# Patient Record
Sex: Female | Born: 1945 | Race: White | Hispanic: No
Health system: Southern US, Community
[De-identification: ages and names within clinical notes are randomized; demographics above are authoritative.]

## PROBLEM LIST (undated history)

## (undated) DIAGNOSIS — J9621 Acute and chronic respiratory failure with hypoxia: Secondary | ICD-10-CM

## (undated) DIAGNOSIS — R918 Other nonspecific abnormal finding of lung field: Secondary | ICD-10-CM

## (undated) DIAGNOSIS — J181 Lobar pneumonia, unspecified organism: Secondary | ICD-10-CM

## (undated) DIAGNOSIS — J189 Pneumonia, unspecified organism: Secondary | ICD-10-CM

## (undated) DIAGNOSIS — J449 Chronic obstructive pulmonary disease, unspecified: Secondary | ICD-10-CM

---

## 2018-04-12 ENCOUNTER — Inpatient Hospital Stay
Admit: 2018-04-12 | Discharge: 2018-04-21 | Disposition: A | Discharge status: Home Health | Payer: Medicare Other | Source: Other Acute Inpatient Hospital | Attending: Internal Medicine | Admitting: Internal Medicine

## 2018-04-12 ENCOUNTER — Other Ambulatory Visit (HOSPITAL_COMMUNITY): Payer: Medicare Other

## 2018-04-12 DIAGNOSIS — J449 Chronic obstructive pulmonary disease, unspecified: Secondary | ICD-10-CM | POA: Diagnosis present

## 2018-04-12 DIAGNOSIS — R918 Other nonspecific abnormal finding of lung field: Secondary | ICD-10-CM | POA: Diagnosis present

## 2018-04-12 DIAGNOSIS — J181 Lobar pneumonia, unspecified organism: Secondary | ICD-10-CM

## 2018-04-12 DIAGNOSIS — J9621 Acute and chronic respiratory failure with hypoxia: Secondary | ICD-10-CM | POA: Diagnosis present

## 2018-04-12 DIAGNOSIS — J189 Pneumonia, unspecified organism: Secondary | ICD-10-CM

## 2018-04-12 HISTORY — DX: Acute and chronic respiratory failure with hypoxia: J96.21

## 2018-04-12 HISTORY — DX: Chronic obstructive pulmonary disease, unspecified: J44.9

## 2018-04-12 HISTORY — DX: Lobar pneumonia, unspecified organism: J18.1

## 2018-04-12 HISTORY — DX: Pneumonia, unspecified organism: J18.9

## 2018-04-12 HISTORY — DX: Other nonspecific abnormal finding of lung field: R91.8

## 2018-04-12 LAB — BASIC METABOLIC PANEL
Anion gap: 9 (ref 5–15)
BUN: 14 mg/dL (ref 8–23)
CALCIUM: 9.3 mg/dL (ref 8.9–10.3)
CO2: 33 mmol/L — ABNORMAL HIGH (ref 22–32)
Chloride: 94 mmol/L — ABNORMAL LOW (ref 98–111)
Creatinine, Ser: 0.62 mg/dL (ref 0.44–1.00)
GFR calc Af Amer: >60 mL/min (ref >60)
GFR calc non Af Amer: >60 mL/min (ref >60)
Glucose, Bld: 84 mg/dL (ref 70–99)
Potassium: 4.1 mmol/L (ref 3.5–5.1)
Sodium: 136 mmol/L (ref 135–145)

## 2018-04-12 LAB — CBC
HCT: 35.1 % — ABNORMAL LOW (ref 36.0–46.0)
Hemoglobin: 10.8 g/dL — ABNORMAL LOW (ref 12.0–15.0)
MCH: 31.2 pg (ref 26.0–34.0)
MCHC: 30.8 g/dL (ref 30.0–36.0)
MCV: 101.4 fL — ABNORMAL HIGH (ref 80.0–100.0)
PLATELETS: 232 10*3/uL (ref 150–400)
RBC: 3.46 MIL/uL — ABNORMAL LOW (ref 3.87–5.11)
RDW: 12.8 % (ref 11.5–15.5)
WBC: 6 10*3/uL (ref 4.0–10.5)
nRBC: 0 % (ref 0.0–0.2)

## 2018-04-12 LAB — PROTIME-INR
INR: 1.13
PROTHROMBIN TIME: 14.4 s (ref 11.4–15.2)

## 2018-04-13 DIAGNOSIS — J449 Chronic obstructive pulmonary disease, unspecified: Secondary | ICD-10-CM

## 2018-04-13 DIAGNOSIS — J9621 Acute and chronic respiratory failure with hypoxia: Secondary | ICD-10-CM | POA: Diagnosis not present

## 2018-04-13 DIAGNOSIS — R918 Other nonspecific abnormal finding of lung field: Secondary | ICD-10-CM

## 2018-04-13 DIAGNOSIS — J181 Lobar pneumonia, unspecified organism: Secondary | ICD-10-CM

## 2018-04-13 NOTE — Consult Note (Signed)
[facility]  Date of Service: 04/13/2018  PULMONARY CRITICAL CARE CONSULT   Laura KinsmanVirginia Phillips  GNF:[id number]  DOB: 09-16-1945   DOA: 04/12/2018  Referring Physician: Carron CurieAli Hijazi, MD  HPI: Laura Phillips is a 72 y.o. female seen for follow up of Acute on Chronic Respiratory Failure.  Patient has multiple medical problems including Sjogren's syndrome chronic back pain non-Hodgkin's lymphoma and COPD presented to the original facility with increasing shortness of breath.  At that time the patient was seen patient was started on aggressive bronchodilator therapy also was started on steroids in the form of prednisone.  Her COPD is deemed to be severe with an FEV1 of approximately 23% she is on chronic oxygen at home on 3 L/min.  And she is also on chronic steroids approximately 10 mg of prednisone daily.  She has a recent history of pneumonia which was treated.  A CT scan was done and it was noted that she had a lung mass.  She was referred to oncology for possible biopsy and it was felt that she might have recurrence of non-Hodgkin's lymphoma.  Because her respiratory status was not stable it was decided that the biopsy would be deferred until she was more stable.  She at this time is doing better.  I was asked to see tomorrow to evaluate the medicines however with the findings of her lung mass we need to further evaluate this.  Review of Systems:  ROS performed and is unremarkable other than noted above.  Past Medical History:  Diagnosis Date  . Benign familial tremor  . Chronic pain  . Colon polyps  . COPD (chronic obstructive pulmonary disease) (CMS/HCC) (HCC)  . DDD (degenerative disc disease), cervical  . Depression  . Diverticulitis  . Emphysema lung (CMS/HCC) (HCC)  . Esophageal stricture  . Hyperlipidemia  . Hypertension  . Lymphoma (CMS/HCC) [facility](HCC)   Past medical history has been independently reviewed by  me.  Past Surgical History:   Past Surgical History:  Procedure Laterality Date  . ABDOMINAL SURGERY  . APPENDECTOMY  . CARPAL TUNNEL RELEASE  . COLONOSCOPY  . HIP ARTHROPLASTY  . HYSTERECTOMY  . ORTHOPEDIC SURGERY  . UPPER GASTROINTESTINAL ENDOSCOPY    Allergies  Allergen Reactions  . Avelox [Moxifloxacin] Anaphylaxis  . Quinolones Anaphylaxis  . Adhesive Rash  Ok with stretchy tape  . Augmentin [Amoxicillin-Pot Clavulanate] GI intolerance and Nausea And Vomiting  . Tapentadol Rash   Allergies are independently reviewed by me.  Social History:   Social History   Tobacco Use  . Smoking status: Current Every Day Smoker  Packs/day: 1.50  Last attempt to quit: 08/14/2016  Years since quitting: 1.6  . Smokeless tobacco: Never Used  Substance Use Topics  . Alcohol use: No  . Drug use: No     Medications: Reviewed on Rounds  Physical Exam:  Vitals: Temperature 97.8 pulse 70 respiratory 18 blood pressure 121/70 saturations 94%  Ventilator Settings currently off the ventilator on nasal cannula  . General: Comfortable at this time . Eyes: Grossly normal lids, irises & conjunctiva . ENT: grossly tongue is normal . Neck: no obvious mass . Cardiovascular: S1-S2 normal no gallop or rub . Respiratory: No rhonchi no rales are noted . Abdomen: Soft nontender . Skin: no rash seen on limited exam . Musculoskeletal: not rigid . Psychiatric:unable to assess . Neurologic: no seizure no involuntary movements         Labs on Admission:  Basic Metabolic Panel: Recent Labs  Lab 04/12/18 0707  NA 136  K 4.1  CL 94*  CO2 33*  GLUCOSE 84  BUN 14  CREATININE 0.62  CALCIUM 9.3    No results for input(s): PHART, PCO2ART, PO2ART, HCO3, O2SAT in the last 168 hours.  Liver Function Tests: No results for input(s): AST, ALT, ALKPHOS, BILITOT, PROT, ALBUMIN in the last 168 hours. No results for input(s): LIPASE, AMYLASE in the last 168 hours. No results for input(s):  AMMONIA in the last 168 hours.  CBC: Recent Labs  Lab 04/12/18 0707  WBC 6.0  HGB 10.8*  HCT 35.1*  MCV 101.4*  PLT 232    Cardiac Enzymes: No results for input(s): CKTOTAL, CKMB, CKMBINDEX, TROPONINI in the last 168 hours.  BNP (last 3 results) No results for input(s): BNP in the last 8760 hours.  ProBNP (last 3 results) No results for input(s): PROBNP in the last 8760 hours.   Radiological Exams on Admission: Dg Chest Port 1 View  Result Date: 04/12/2018 CLINICAL DATA:  COPD exacerbation. EXAM: PORTABLE CHEST 1 VIEW COMPARISON:  None. FINDINGS: Cardiac silhouette upper normal in size. Thoracic aorta atherosclerotic. Hilar and mediastinal contours otherwise unremarkable. Emphysematous changes throughout both lungs with hyperinflation and mildly prominent bronchovascular markings diffusely. Lungs otherwise clear. No confluent airspace consolidation. No pleural effusions. RIGHT jugular central venous catheter tip projects over the UPPER SVC. IMPRESSION: 1. No acute cardiopulmonary disease. 2.  Emphysema. ([id number](ICD10-J43.9) Electronically Signed   By: Hulan Saashomas  Lawrence M.D.   On: 04/12/2018 12:50    Assessment/Plan Active Problems:   Acute on chronic respiratory failure with hypoxia (HCC)   COPD, severe (HCC)   Lung mass   Left lower lobe pneumonia (HCC)   1. Acute on chronic respiratory failure with hypoxia patient is doing fine right now on nasal cannula we will continue to titrate oxygen as tolerated continue pulmonary toilet secretion management supportive care. 2. COPD medications reviewed discussed with the staff on rounds medications will be adjusted accordingly 3. Lung mass she needs further evaluation I recommended that we get a CT scan here in the hospital if able to then she could possibly have a biopsy for diagnosis we will discuss this further with primary care team. 4. Left lower lobe pneumonia treated we will continue with present management.   I have personally  seen and evaluated the patient, evaluated laboratory and imaging results, formulated the assessment and plan and placed orders. The Patient requires high complexity decision making for assessment and support.  Case was discussed on Rounds with the Respiratory Therapy Staff Time Spent 70minutes  Yevonne PaxSaadat A , MD [facility]arksFCCP Pulmonary Critical Care Medicine Sleep Medicine

## 2018-04-14 ENCOUNTER — Encounter: Payer: Self-pay | Admitting: Internal Medicine

## 2018-04-14 DIAGNOSIS — J9621 Acute and chronic respiratory failure with hypoxia: Secondary | ICD-10-CM | POA: Diagnosis not present

## 2018-04-14 DIAGNOSIS — J189 Pneumonia, unspecified organism: Secondary | ICD-10-CM | POA: Diagnosis present

## 2018-04-14 DIAGNOSIS — J449 Chronic obstructive pulmonary disease, unspecified: Secondary | ICD-10-CM | POA: Diagnosis present

## 2018-04-14 DIAGNOSIS — R918 Other nonspecific abnormal finding of lung field: Secondary | ICD-10-CM | POA: Diagnosis present

## 2018-04-14 DIAGNOSIS — J181 Lobar pneumonia, unspecified organism: Secondary | ICD-10-CM

## 2018-04-14 MED ORDER — FOLIC ACID 1 MG PO TABS
1.00 | ORAL_TABLET | ORAL | Status: DC
Start: 2018-04-12 — End: 2018-04-14

## 2018-04-14 MED ORDER — BENZONATATE 100 MG PO CAPS
100.00 | ORAL_CAPSULE | ORAL | Status: DC
Start: ? — End: 2018-04-14

## 2018-04-14 MED ORDER — PRAMIPEXOLE DIHYDROCHLORIDE 0.5 MG PO TABS
1.50 | ORAL_TABLET | ORAL | Status: DC
Start: 2018-04-12 — End: 2018-04-14

## 2018-04-14 MED ORDER — HYDROMORPHONE HCL 2 MG PO TABS
2.00 | ORAL_TABLET | ORAL | Status: DC
Start: ? — End: 2018-04-14

## 2018-04-14 MED ORDER — OMEPRAZOLE 20 MG PO CPDR
40.00 | DELAYED_RELEASE_CAPSULE | ORAL | Status: DC
Start: 2018-04-12 — End: 2018-04-14

## 2018-04-14 MED ORDER — ACETAMINOPHEN 325 MG PO TABS
650.00 | ORAL_TABLET | ORAL | Status: DC
Start: ? — End: 2018-04-14

## 2018-04-14 MED ORDER — LUBIPROSTONE 24 MCG PO CAPS
24.00 | ORAL_CAPSULE | ORAL | Status: DC
Start: 2018-04-12 — End: 2018-04-14

## 2018-04-14 MED ORDER — LEVALBUTEROL HCL 1.25 MG/3ML IN NEBU
1.25 | INHALATION_SOLUTION | RESPIRATORY_TRACT | Status: DC
Start: 2018-04-12 — End: 2018-04-14

## 2018-04-14 MED ORDER — IPRATROPIUM BROMIDE 0.02 % IN SOLN
0.50 | RESPIRATORY_TRACT | Status: DC
Start: 2018-04-12 — End: 2018-04-14

## 2018-04-14 MED ORDER — ONDANSETRON HCL 4 MG PO TABS
8.00 | ORAL_TABLET | ORAL | Status: DC
Start: ? — End: 2018-04-14

## 2018-04-14 MED ORDER — GUAIFENESIN ER 600 MG PO TB12
1200.00 | ORAL_TABLET | ORAL | Status: DC
Start: 2018-04-12 — End: 2018-04-14

## 2018-04-14 MED ORDER — VENLAFAXINE HCL 37.5 MG PO TABS
75.00 | ORAL_TABLET | ORAL | Status: DC
Start: 2018-04-12 — End: 2018-04-14

## 2018-04-14 MED ORDER — GENERIC EXTERNAL MEDICATION
15.00 | Status: DC
Start: 2018-04-12 — End: 2018-04-14

## 2018-04-14 MED ORDER — HYDROXYCHLOROQUINE SULFATE 200 MG PO TABS
200.00 | ORAL_TABLET | ORAL | Status: DC
Start: 2018-04-12 — End: 2018-04-14

## 2018-04-14 MED ORDER — METHYLPREDNISOLONE SODIUM SUCC 40 MG IJ SOLR
40.00 | INTRAMUSCULAR | Status: DC
Start: 2018-04-12 — End: 2018-04-14

## 2018-04-14 MED ORDER — HYDROCOD POLST-CPM POLST ER 10-8 MG/5ML PO SUER
5.00 | ORAL | Status: DC
Start: ? — End: 2018-04-14

## 2018-04-14 MED ORDER — PRIMIDONE 250 MG PO TABS
375.00 | ORAL_TABLET | ORAL | Status: DC
Start: 2018-04-12 — End: 2018-04-14

## 2018-04-14 MED ORDER — BUSPIRONE HCL 5 MG PO TABS
5.00 | ORAL_TABLET | ORAL | Status: DC
Start: 2018-04-12 — End: 2018-04-14

## 2018-04-14 MED ORDER — ALBUTEROL SULFATE (2.5 MG/3ML) 0.083% IN NEBU
2.50 | INHALATION_SOLUTION | RESPIRATORY_TRACT | Status: DC
Start: ? — End: 2018-04-14

## 2018-04-14 MED ORDER — POTASSIUM CHLORIDE CRYS ER 20 MEQ PO TBCR
20.00 | EXTENDED_RELEASE_TABLET | ORAL | Status: DC
Start: 2018-04-12 — End: 2018-04-14

## 2018-04-14 MED ORDER — ENOXAPARIN SODIUM 40 MG/0.4ML ~~LOC~~ SOLN
40.00 | SUBCUTANEOUS | Status: DC
Start: 2018-04-12 — End: 2018-04-14

## 2018-04-14 MED ORDER — GABAPENTIN 300 MG PO CAPS
300.00 | ORAL_CAPSULE | ORAL | Status: DC
Start: 2018-04-12 — End: 2018-04-14

## 2018-04-14 NOTE — Progress Notes (Signed)
[facility]   PULMONARY CRITICAL CARE SERVICE  PROGRESS NOTE  Date of Service: 04/14/2018  Laura Phillips  ZOX:[id number]  DOB: [birth year]   DOA: 04/12/2018  Referring Physician: Carron CurieAli Hijazi, MD  HPI: Laura KinsmanVirginia Co is a 72 y.o. female seen for follow up of Acute on Chronic Respiratory Failure.  Patient is doing little bit better remains on nasal cannula 3 L oxygen.  The patient is stable on the current inhalers.  I spoke with her respiratory therapist and we adjusted her nebulizers accordingly.  Medications: Reviewed on Rounds  Physical Exam:  Vitals: Temperature 97.9 pulse 76 respiratory 24 blood pressure 123/71 saturations 95%  Ventilator Settings off the ventilator on 3 L  . General: Comfortable at this time . Eyes: Grossly normal lids, irises & conjunctiva . ENT: grossly tongue is normal . Neck: no obvious mass . Cardiovascular: S1 S2 normal no gallop . Respiratory: No rhonchi or rales are noted at this time . Abdomen: soft . Skin: no rash seen on limited exam . Musculoskeletal: not rigid . Psychiatric:unable to assess . Neurologic: no seizure no involuntary movements         Lab Data:   Basic Metabolic Panel: Recent Labs  Lab 04/12/18 0707  NA 136  K 4.1  CL 94*  CO2 33*  GLUCOSE 84  BUN 14  CREATININE 0.62  CALCIUM 9.3    ABG: No results for input(s): PHART, PCO2ART, PO2ART, HCO3, O2SAT in the last 168 hours.  Liver Function Tests: No results for input(s): AST, ALT, ALKPHOS, BILITOT, PROT, ALBUMIN in the last 168 hours. No results for input(s): LIPASE, AMYLASE in the last 168 hours. No results for input(s): AMMONIA in the last 168 hours.  CBC: Recent Labs  Lab 04/12/18 0707  WBC 6.0  HGB 10.8*  HCT 35.1*  MCV 101.4*  PLT 232    Cardiac Enzymes: No results for input(s): CKTOTAL, CKMB, CKMBINDEX, TROPONINI in the last 168 hours.  BNP (last 3 results) No results for input(s): BNP in the  last 8760 hours.  ProBNP (last 3 results) No results for input(s): PROBNP in the last 8760 hours.  Radiological Exams: No results found.  Assessment/Plan Active Problems:   Acute on chronic respiratory failure with hypoxia (HCC)   COPD, severe (HCC)   Lung mass   Left lower lobe pneumonia (HCC)   1. Acute on chronic respiratory failure with hypoxia continue with oxygen therapy she is stable on 3 L.  In addition inhalers have been adjusted. 2. Severe COPD at baseline continue with the duo nebs as ordered.  She will need follow-up with her primary pulmonologist after discharge. 3. Lung mass she will need follow-up after discharge with primary pulmonologist oncologist 4. Left lower lobe pneumonia treated we will continue to monitor   I have personally seen and evaluated the patient, evaluated laboratory and imaging results, formulated the assessment and plan and placed orders. The Patient requires high complexity decision making for assessment and support.  Case was discussed on Rounds with the Respiratory Therapy Staff  Yevonne PaxSaadat A Richy Spradley, MD [facility] HospitalFCCP Pulmonary Critical Care Medicine Sleep Medicine

## 2018-04-15 DIAGNOSIS — J449 Chronic obstructive pulmonary disease, unspecified: Secondary | ICD-10-CM | POA: Diagnosis not present

## 2018-04-15 DIAGNOSIS — J9621 Acute and chronic respiratory failure with hypoxia: Secondary | ICD-10-CM | POA: Diagnosis not present

## 2018-04-15 DIAGNOSIS — J181 Lobar pneumonia, unspecified organism: Secondary | ICD-10-CM | POA: Diagnosis not present

## 2018-04-15 DIAGNOSIS — R918 Other nonspecific abnormal finding of lung field: Secondary | ICD-10-CM | POA: Diagnosis not present

## 2018-04-15 LAB — VANCOMYCIN, TROUGH: Vancomycin Tr: 10 ug/mL — ABNORMAL LOW (ref 15–20)

## 2018-04-15 NOTE — Progress Notes (Signed)
Pulmonary Critical Care Medicine [facility]   PULMONARY CRITICAL CARE SERVICE  PROGRESS NOTE  Date of Service: 04/15/2018  Laura KinsmanVirginia Phillips  [id number]  DOB: [birth year]   DOA: 04/12/2018  Referring Physician: Carron CurieAli Hijazi, MD  HPI: Laura KinsmanVirginia Phillips is a 72 y.o. female seen for follow up of Acute on Chronic Respiratory Failure.  Patient currently is on nasal cannula doing fairly well.  She had several questions regarding the findings of her CT scan at the previous facility these were answered.  Also requested a follow-up CT scan to be done.  Medications: Reviewed on Rounds  Physical Exam:  Vitals: Temperature 97.7 pulse 93 respiratory rate 16 blood pressure 137/68 saturations 99%  Ventilator Settings off the ventilator on nasal cannula  . General: Comfortable at this time . Eyes: Grossly normal lids, irises & conjunctiva . ENT: grossly tongue is normal . Neck: no obvious mass . Cardiovascular: S1 S2 normal no gallop . Respiratory: No rhonchi or rales are noted . Abdomen: soft . Skin: no rash seen on limited exam . Musculoskeletal: not rigid . Psychiatric:unable to assess . Neurologic: no seizure no involuntary movements         Lab Data:   Basic Metabolic Panel: Recent Labs  Lab 04/12/18 0707  NA 136  K 4.1  CL 94*  CO2 33*  GLUCOSE 84  BUN 14  CREATININE 0.62  CALCIUM 9.3    ABG: No results for input(s): PHART, PCO2ART, PO2ART, HCO3, O2SAT in the last 168 hours.  Liver Function Tests: No results for input(s): AST, ALT, ALKPHOS, BILITOT, PROT, ALBUMIN in the last 168 hours. No results for input(s): LIPASE, AMYLASE in the last 168 hours. No results for input(s): AMMONIA in the last 168 hours.  CBC: Recent Labs  Lab 04/12/18 0707  WBC 6.0  HGB 10.8*  HCT 35.1*  MCV 101.4*  PLT 232    Cardiac Enzymes: No results for input(s): CKTOTAL, CKMB, CKMBINDEX, TROPONINI in the last 168 hours.  BNP (last 3 results) No results for  input(s): BNP in the last 8760 hours.  ProBNP (last 3 results) No results for input(s): PROBNP in the last 8760 hours.  Radiological Exams: No results found.  Assessment/Plan Active Problems:   Acute on chronic respiratory failure with hypoxia (HCC)   COPD, severe (HCC)   Lung mass   Left lower lobe pneumonia (HCC)   1. Acute on chronic respiratory failure with hypoxia we will continue with oxygen therapy she is on 3 L at baseline at home which will be continued 2. Severe COPD advanced disease continue with present management 3. Lung masses likely recurrence of non-Hodgkin's lymphoma we will continue with supportive care follow-up CT she needs follow-up with oncology 4. Lower lobe pneumonia treated we will continue to monitor   I have personally seen and evaluated the patient, evaluated laboratory and imaging results, formulated the assessment and plan and placed orders. The Patient requires high complexity decision making for assessment and support.  Case was discussed on Rounds with the Respiratory Therapy Staff  Yevonne PaxSaadat A Keyden Pavlov, MD [facility]P Pulmonary Critical Care Medicine Sleep Medicine

## 2018-04-16 ENCOUNTER — Other Ambulatory Visit (HOSPITAL_COMMUNITY): Payer: Medicare Other

## 2018-04-16 DIAGNOSIS — J449 Chronic obstructive pulmonary disease, unspecified: Secondary | ICD-10-CM | POA: Diagnosis not present

## 2018-04-16 DIAGNOSIS — R918 Other nonspecific abnormal finding of lung field: Secondary | ICD-10-CM | POA: Diagnosis not present

## 2018-04-16 DIAGNOSIS — J9621 Acute and chronic respiratory failure with hypoxia: Secondary | ICD-10-CM | POA: Diagnosis not present

## 2018-04-16 DIAGNOSIS — J181 Lobar pneumonia, unspecified organism: Secondary | ICD-10-CM | POA: Diagnosis not present

## 2018-04-16 MED ORDER — IOHEXOL 300 MG/ML  SOLN
75.0000 mL | Freq: Once | INTRAMUSCULAR | Status: AC | PRN
  Administered 2018-04-16: 75 mL via INTRAVENOUS

## 2018-04-16 NOTE — Progress Notes (Addendum)
Pulmonary Critical Care Medicine [facility] [place]   PULMONARY CRITICAL CARE SERVICE  PROGRESS NOTE  Date of Service: 04/16/2018  Laura Phillips  [id number]  DOB: [birth year]   DOA: 04/12/2018  Referring Physician: Carron Curie, MD  HPI: Laura Phillips is a 73 y.o. female seen for follow up of Acute on Chronic Respiratory Failure.  Patient currently doing well on 3 L/min via nasal cannula.  No distress noted.  Medications: Reviewed on Rounds  Physical Exam:  Vitals: Pulse 78 respirations 18 blood pressure 130/65 O2 saturation 96% temp 97.6  Ventilator Settings patient is not currently on ventilator  . General: Comfortable at this time . Eyes: Grossly normal lids, irises & conjunctiva . ENT: grossly tongue is normal . Neck: no obvious mass . Cardiovascular: S1 S2 normal no gallop . Respiratory: No wheezes or rhonchi noted . Abdomen: soft . Skin: no rash seen on limited exam . Musculoskeletal: not rigid . Psychiatric:unable to assess . Neurologic: no seizure no involuntary movements         Lab Data:   Basic Metabolic Panel: Recent Labs  Lab 04/12/18 0707  NA 136  K 4.1  CL 94*  CO2 33*  GLUCOSE 84  BUN 14  CREATININE 0.62  CALCIUM 9.3    ABG: No results for input(s): PHART, PCO2ART, PO2ART, HCO3, O2SAT in the last 168 hours.  Liver Function Tests: No results for input(s): AST, ALT, ALKPHOS, BILITOT, PROT, ALBUMIN in the last 168 hours. No results for input(s): LIPASE, AMYLASE in the last 168 hours. No results for input(s): AMMONIA in the last 168 hours.  CBC: Recent Labs  Lab 04/12/18 0707  WBC 6.0  HGB 10.8*  HCT 35.1*  MCV 101.4*  PLT 232    Cardiac Enzymes: No results for input(s): CKTOTAL, CKMB, CKMBINDEX, TROPONINI in the last 168 hours.  BNP (last 3 results) No results for input(s): BNP in the last 8760 hours.  ProBNP (last 3 results) No results for input(s): PROBNP in the last 8760 hours.  Radiological Exams: Ct  Chest W Contrast  Result Date: 04/16/2018 CLINICAL DATA:  Inpatient. Non-Hodgkin's lymphoma. COPD. Acute on chronic respiratory failure. Hypoxia. EXAM: CT CHEST WITH CONTRAST TECHNIQUE: Multidetector CT imaging of the chest was performed during intravenous contrast administration. CONTRAST:  14mL OMNIPAQUE IOHEXOL 300 MG/ML  SOLN COMPARISON:  04/12/2018 chest radiograph. FINDINGS: Cardiovascular: Normal heart size. No significant pericardial effusion/thickening. Three-vessel coronary atherosclerosis. Atherosclerotic nonaneurysmal thoracic aorta. Normal caliber pulmonary arteries. No central pulmonary emboli. Right internal jugular central venous catheter terminates in the middle third of the SVC. Mediastinum/Nodes: No discrete thyroid nodules. Unremarkable esophagus. No pathologically enlarged axillary, mediastinal or hilar lymph nodes. Lungs/Pleura: No pneumothorax. No pleural effusion. Severe centrilobular emphysema with diffuse bronchial wall thickening. Spiculated solid 1.6 cm basilar right upper lobe pulmonary nodule (series 4/image 91). Irregular bandlike nodular focus of consolidation in the posterior left lower lobe measuring 1.8 x 1.6 cm (series 4/image 124). No additional significant pulmonary nodules. Upper abdomen: No acute abnormality. Musculoskeletal: No aggressive appearing focal osseous lesions. Multiple healed lower left rib fractures. Moderate thoracic spondylosis. IMPRESSION: 1. Spiculated solid 1.6 cm basilar right upper lobe pulmonary nodule, morphologically suspicious for primary bronchogenic carcinoma. Multidisciplinary thoracic oncology consultation suggested. PET-CT suggested on a short term outpatient basis for further evaluation. 2. Irregular bandlike nodular focus of consolidation in the posterior left lower lobe, indeterminate, potentially representing evolving postinfectious scarring. Recommend attention on follow-up chest CT in 3 months. 3. Severe centrilobular emphysema with diffuse  bronchial wall  thickening, compatible with the provided history of COPD. 4. No thoracic adenopathy. 5. Three-vessel coronary atherosclerosis. Aortic Atherosclerosis (ICD10-I70.0) and Emphysema (ICD10-J43.9). Electronically Signed   By: Delbert PhenixJason A Poff M.D.   On: 04/16/2018 02:41    Assessment/Plan Active Problems:   Acute on chronic respiratory failure with hypoxia (HCC)   COPD, severe (HCC)   Lung mass   Left lower lobe pneumonia (HCC)   1. Acute on chronic respiratory failure with hypoxia continue with oxygen therapy on 3 L nasal cannula which is her baseline at home. 2. Severe COPD advanced disease continue with present management 3. Lung masses likely recurrence of non-Hodgkin's lymphoma continue supportive care follow-up CT performed today, results above. 4. Lower lobe pneumonia treated continue to monitor   I have personally seen and evaluated the patient, evaluated laboratory and imaging results, formulated the assessment and plan and placed orders. The Patient requires high complexity decision making for assessment and support.  Case was discussed on Rounds with the Respiratory Therapy Staff  Yevonne PaxSaadat A Klaira Pesci, MD [facility] Pulmonary Critical Care Medicine Sleep Medicine

## 2018-04-17 DIAGNOSIS — J449 Chronic obstructive pulmonary disease, unspecified: Secondary | ICD-10-CM | POA: Diagnosis not present

## 2018-04-17 DIAGNOSIS — J9621 Acute and chronic respiratory failure with hypoxia: Secondary | ICD-10-CM | POA: Diagnosis not present

## 2018-04-17 DIAGNOSIS — R918 Other nonspecific abnormal finding of lung field: Secondary | ICD-10-CM | POA: Diagnosis not present

## 2018-04-17 DIAGNOSIS — J181 Lobar pneumonia, unspecified organism: Secondary | ICD-10-CM | POA: Diagnosis not present

## 2018-04-17 NOTE — Progress Notes (Signed)
[facility] [place]   PULMONARY CRITICAL CARE SERVICE  PROGRESS NOTE  Date of Service: 04/17/2018  Laura KinsmanVirginia Cristiano  AVW:[id number]  DOB: [birth year]   DOA: 04/12/2018  Referring Physician: Carron CurieAli Hijazi, MD  HPI: Laura Phillips is a 73 y.o. female seen for follow up of Acute on Chronic Respiratory Failure.  Patient is currently on nasal cannula has been having some issues with wheezing.  She is on Advair as well as duo nebs.  Patient was also on steroids.  She does have some issues with anxiety.  Medications: Reviewed on Rounds  Physical Exam:  Vitals: Temperature 97.5 pulse 72 respiratory 18 blood pressure 130/70 saturations 95%  Ventilator Settings off the ventilator on nasal cannula right now  . General: Comfortable at this time . Eyes: Grossly normal lids, irises & conjunctiva . ENT: grossly tongue is normal . Neck: no obvious mass . Cardiovascular: S1 S2 normal no gallop . Respiratory: No rhonchi or rales are noted . Abdomen: soft . Skin: no rash seen on limited exam . Musculoskeletal: not rigid . Psychiatric:unable to assess . Neurologic: no seizure no involuntary movements         Lab Data:   Basic Metabolic Panel: Recent Labs  Lab 04/12/18 0707  NA 136  K 4.1  CL 94*  CO2 33*  GLUCOSE 84  BUN 14  CREATININE 0.62  CALCIUM 9.3    ABG: No results for input(s): PHART, PCO2ART, PO2ART, HCO3, O2SAT in the last 168 hours.  Liver Function Tests: No results for input(s): AST, ALT, ALKPHOS, BILITOT, PROT, ALBUMIN in the last 168 hours. No results for input(s): LIPASE, AMYLASE in the last 168 hours. No results for input(s): AMMONIA in the last 168 hours.  CBC: Recent Labs  Lab 04/12/18 0707  WBC 6.0  HGB 10.8*  HCT 35.1*  MCV 101.4*  PLT 232    Cardiac Enzymes: No results for input(s): CKTOTAL, CKMB, CKMBINDEX, TROPONINI in the last 168 hours.  BNP (last 3 results) No results for input(s): BNP in the last  8760 hours.  ProBNP (last 3 results) No results for input(s): PROBNP in the last 8760 hours.  Radiological Exams: Ct Chest W Contrast  Result Date: 04/16/2018 CLINICAL DATA:  Inpatient. Non-Hodgkin's lymphoma. COPD. Acute on chronic respiratory failure. Hypoxia. EXAM: CT CHEST WITH CONTRAST TECHNIQUE: Multidetector CT imaging of the chest was performed during intravenous contrast administration. CONTRAST:  75mL OMNIPAQUE IOHEXOL 300 MG/ML  SOLN COMPARISON:  04/12/2018 chest radiograph. FINDINGS: Cardiovascular: Normal heart size. No significant pericardial effusion/thickening. Three-vessel coronary atherosclerosis. Atherosclerotic nonaneurysmal thoracic aorta. Normal caliber pulmonary arteries. No central pulmonary emboli. Right internal jugular central venous catheter terminates in the middle third of the SVC. Mediastinum/Nodes: No discrete thyroid nodules. Unremarkable esophagus. No pathologically enlarged axillary, mediastinal or hilar lymph nodes. Lungs/Pleura: No pneumothorax. No pleural effusion. Severe centrilobular emphysema with diffuse bronchial wall thickening. Spiculated solid 1.6 cm basilar right upper lobe pulmonary nodule (series 4/image 91). Irregular bandlike nodular focus of consolidation in the posterior left lower lobe measuring 1.8 x 1.6 cm (series 4/image 124). No additional significant pulmonary nodules. Upper abdomen: No acute abnormality. Musculoskeletal: No aggressive appearing focal osseous lesions. Multiple healed lower left rib fractures. Moderate thoracic spondylosis. IMPRESSION: 1. Spiculated solid 1.6 cm basilar right upper lobe pulmonary nodule, morphologically suspicious for primary bronchogenic carcinoma. Multidisciplinary thoracic oncology consultation suggested. PET-CT suggested on a short term outpatient basis for further evaluation. 2. Irregular bandlike nodular focus of consolidation in the posterior left lower  lobe, indeterminate, potentially representing evolving  postinfectious scarring. Recommend attention on follow-up chest CT in 3 months. 3. Severe centrilobular emphysema with diffuse bronchial wall thickening, compatible with the provided history of COPD. 4. No thoracic adenopathy. 5. Three-vessel coronary atherosclerosis. Aortic Atherosclerosis (ICD10-I70.0) and Emphysema (ICD10-J43.9). Electronically Signed   By: Delbert PhenixJason A Poff M.D.   On: 04/16/2018 02:41    Assessment/Plan Active Problems:   Acute on chronic respiratory failure with hypoxia (HCC)   COPD, severe (HCC)   Lung mass   Left lower lobe pneumonia (HCC)   1. Acute on chronic respiratory failure with hypoxia patient will continue with oxygen therapy as ordered. 2. History of non-Hodgkin's lymphoma.  She had a CT scan which shows a solid basilar pulmonary nodule and also irregular consolidation areas in the left lower lobe which is indeterminate.  She and I had spoke about this and she wants to be followed up by her oncologist that she has been seeing. 3. Severe COPD continue with inhaler steroids.  Monitor her response 4. Left lower lobe pneumonia has been treated we will continue to monitor closely   I have personally seen and evaluated the patient, evaluated laboratory and imaging results, formulated the assessment and plan and placed orders. The Patient requires high complexity decision making for assessment and support.  Case was discussed on Rounds with the Respiratory Therapy Staff  Yevonne PaxSaadat A Khan, MD [facility] Pulmonary Critical Care Medicine Sleep Medicine

## 2018-04-18 DIAGNOSIS — R918 Other nonspecific abnormal finding of lung field: Secondary | ICD-10-CM | POA: Diagnosis not present

## 2018-04-18 DIAGNOSIS — J181 Lobar pneumonia, unspecified organism: Secondary | ICD-10-CM | POA: Diagnosis not present

## 2018-04-18 DIAGNOSIS — J449 Chronic obstructive pulmonary disease, unspecified: Secondary | ICD-10-CM | POA: Diagnosis not present

## 2018-04-18 DIAGNOSIS — J9621 Acute and chronic respiratory failure with hypoxia: Secondary | ICD-10-CM | POA: Diagnosis not present

## 2018-04-18 LAB — CBC WITH DIFFERENTIAL/PLATELET
Abs Immature Granulocytes: 0.07 10*3/uL (ref 0.00–0.07)
BASOS ABS: 0.1 10*3/uL (ref 0.0–0.1)
Basophils Relative: 1 %
Eosinophils Absolute: 0.1 10*3/uL (ref 0.0–0.5)
Eosinophils Relative: 1 %
HCT: 36.4 % (ref 36.0–46.0)
Hemoglobin: 11.5 g/dL — ABNORMAL LOW (ref 12.0–15.0)
Immature Granulocytes: 1 %
Lymphocytes Relative: 8 %
Lymphs Abs: 0.7 10*3/uL (ref 0.7–4.0)
MCH: 31.9 pg (ref 26.0–34.0)
MCHC: 31.6 g/dL (ref 30.0–36.0)
MCV: 101.1 fL — ABNORMAL HIGH (ref 80.0–100.0)
Monocytes Absolute: 0.2 10*3/uL (ref 0.1–1.0)
Monocytes Relative: 2 %
NEUTROS ABS: 7.7 10*3/uL (ref 1.7–7.7)
Neutrophils Relative %: 87 %
Platelets: 219 10*3/uL (ref 150–400)
RBC: 3.6 MIL/uL — ABNORMAL LOW (ref 3.87–5.11)
RDW: 13 % (ref 11.5–15.5)
WBC: 8.7 10*3/uL (ref 4.0–10.5)
nRBC: 0 % (ref 0.0–0.2)

## 2018-04-18 LAB — CK TOTAL AND CKMB (NOT AT ARMC)
CK TOTAL: 51 U/L (ref 38–234)
CK, MB: 5 ng/mL (ref 0.5–5.0)
CK, MB: 4.7 ng/mL (ref 0.5–5.0)
Relative Index: INVALID (ref 0.0–2.5)
Relative Index: INVALID (ref 0.0–2.5)
Total CK: 45 U/L (ref 38–234)

## 2018-04-18 LAB — TROPONIN I
Troponin I: 0.04 ng/mL (ref <0.03)
Troponin I: 0.04 ng/mL (ref <0.03)
Troponin I: 0.03 ng/mL (ref <0.03)

## 2018-04-18 NOTE — Progress Notes (Signed)
Pulmonary Critical Care Medicine [facility] [place]   PULMONARY CRITICAL CARE SERVICE  PROGRESS NOTE  Date of Service: 04/18/2018  Laura Phillips  ZOX:[id number]  DOB: 06-15-1945   DOA: 04/12/2018  Referring Physician: Carron Curie, MD  HPI: Laura Phillips is a 73 y.o. female seen for follow up of Acute on Chronic Respiratory Failure.  Patient is comfortable without distress she remains on nasal cannula at this time.  The patient had questions about her CT scan which were answered.  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  She still wants to follow-up with her primary treatment team after discharge  Medications: Reviewed on Rounds  Physical Exam:  Vitals: Temperature 98.1 pulse 87 respiratory rate 20 blood pressure 118/66 saturations 96%  Ventilator Settings off the ventilator on nasal cannula  . General: Comfortable at this time . Eyes: Grossly normal lids, irises & conjunctiva . ENT: grossly tongue is normal . Neck: no obvious mass . Cardiovascular: S1 S2 normal no gallop . Respiratory: No rhonchi rales are noted at this time . Abdomen: soft . Skin: no rash seen on limited exam . Musculoskeletal: not rigid . Psychiatric:unable to assess . Neurologic: no seizure no involuntary movements         Lab Data:   Basic Metabolic Panel: Recent Labs  Lab 04/12/18 0707  NA 136  K 4.1  CL 94*  CO2 33*  GLUCOSE 84  BUN 14  CREATININE 0.62  CALCIUM 9.3    ABG: No results for input(s): PHART, PCO2ART, PO2ART, HCO3, O2SAT in the last 168 hours.  Liver Function Tests: No results for input(s): AST, ALT, ALKPHOS, BILITOT, PROT, ALBUMIN in the last 168 hours. No results for input(s): LIPASE, AMYLASE in the last 168 hours. No results for input(s): AMMONIA in the last 168 hours.  CBC: Recent Labs  Lab 04/12/18 0707  WBC 6.0  HGB 10.8*  HCT 35.1*  MCV 101.4*  PLT 232    Cardiac  Enzymes: No results for input(s): CKTOTAL, CKMB, CKMBINDEX, TROPONINI in the last 168 hours.  BNP (last 3 results) No results for input(s): BNP in the last 8760 hours.  ProBNP (last 3 results) No results for input(s): PROBNP in the last 8760 hours.  Radiological Exams: No results found.  Assessment/Plan Active Problems:   Acute on chronic respiratory failure with hypoxia (HCC)   COPD, severe (HCC)   Lung mass   Left lower lobe pneumonia (HCC)   1. Acute on chronic respiratory failure with hypoxia we will continue with nasal cannula titrate oxygen as tolerated.  We will continue pulmonary toilet supportive care. 2. Lung mass worrisome for neoplastic process.  She is going to need follow-up after discharge with her primary oncologist. 3. Severe COPD at baseline we will continue with present management. 4. Left lower lobe pneumonia treated we will continue with supportive care and continue oxygen therapy   I have personally seen and evaluated the patient, evaluated laboratory and imaging results, formulated the assessment and plan and placed orders. The Patient requires high complexity decision making for assessment and support.  Case was discussed on Rounds with the Respiratory Therapy Staff  Yevonne Pax, MD [facility] Pulmonary Critical Care Medicine Sleep Medicine

## 2018-04-19 DIAGNOSIS — R918 Other nonspecific abnormal finding of lung field: Secondary | ICD-10-CM | POA: Diagnosis not present

## 2018-04-19 DIAGNOSIS — J181 Lobar pneumonia, unspecified organism: Secondary | ICD-10-CM | POA: Diagnosis not present

## 2018-04-19 DIAGNOSIS — J449 Chronic obstructive pulmonary disease, unspecified: Secondary | ICD-10-CM | POA: Diagnosis not present

## 2018-04-19 DIAGNOSIS — J9621 Acute and chronic respiratory failure with hypoxia: Secondary | ICD-10-CM | POA: Diagnosis not present

## 2018-04-19 LAB — CK TOTAL AND CKMB (NOT AT ARMC)
CK, MB: 3.3 ng/mL (ref 0.5–5.0)
CK, MB: 3.4 ng/mL (ref 0.5–5.0)
CK, MB: 3.5 ng/mL (ref 0.5–5.0)
CK, MB: 2.5 ng/mL (ref 0.5–5.0)
Relative Index: INVALID (ref 0.0–2.5)
Relative Index: INVALID (ref 0.0–2.5)
Relative Index: INVALID (ref 0.0–2.5)
Relative Index: INVALID (ref 0.0–2.5)
Total CK: 38 U/L (ref 38–234)
Total CK: 59 U/L (ref 38–234)
Total CK: 49 U/L (ref 38–234)
Total CK: 25 U/L — ABNORMAL LOW (ref 38–234)

## 2018-04-19 LAB — TROPONIN I
Troponin I: <0.03 ng/mL (ref <0.03)
Troponin I: <0.03 ng/mL (ref <0.03)
Troponin I: <0.03 ng/mL (ref <0.03)

## 2018-04-19 NOTE — Progress Notes (Signed)
Pulmonary Critical Care Medicine [facility] [place]   PULMONARY CRITICAL CARE SERVICE  PROGRESS NOTE  Date of Service: 04/19/2018  Chyrel Larocque  PPI:[id number]  DOB: March 15, 1946   DOA: 04/12/2018  Referring Physician: Carron Curie, MD  HPI: Adylyn Heiting is a 73 y.o. female seen for follow up of Acute on Chronic Respiratory Failure.  Patient is doing fine she is on nasal cannula at this time comfortable without distress  Medications: Reviewed on Rounds  Physical Exam:  Vitals: Temperature 98.4 pulse 94 respiratory rate 22 blood pressure 126/61 saturations 96%  Ventilator Settings off the ventilator on nasal cannula  . General: Comfortable at this time . Eyes: Grossly normal lids, irises & conjunctiva . ENT: grossly tongue is normal . Neck: no obvious mass . Cardiovascular: S1 S2 normal no gallop . Respiratory: No rhonchi or rales are noted . Abdomen: soft . Skin: no rash seen on limited exam . Musculoskeletal: not rigid . Psychiatric:unable to assess . Neurologic: no seizure no involuntary movements         Lab Data:   Basic Metabolic Panel: No results for input(s): NA, K, CL, CO2, GLUCOSE, BUN, CREATININE, CALCIUM, MG, PHOS in the last 168 hours.  ABG: No results for input(s): PHART, PCO2ART, PO2ART, HCO3, O2SAT in the last 168 hours.  Liver Function Tests: No results for input(s): AST, ALT, ALKPHOS, BILITOT, PROT, ALBUMIN in the last 168 hours. No results for input(s): LIPASE, AMYLASE in the last 168 hours. No results for input(s): AMMONIA in the last 168 hours.  CBC: Recent Labs  Lab 04/18/18 1639  WBC 8.7  NEUTROABS 7.7  HGB 11.5*  HCT 36.4  MCV 101.1*  PLT 219    Cardiac Enzymes: Recent Labs  Lab 04/18/18 1400 04/18/18 1638 04/18/18 1639 04/18/18 1916 04/19/18 0031 04/19/18 0643 04/19/18 1256  CKTOTAL 51 45  --   --  38 59 49  CKMB 5.0 4.7  --   --  3.3 3.4 3.5  TROPONINI 0.04*  --  0.04* 0.03* <0.03 <0.03 <0.03    BNP (last  3 results) No results for input(s): BNP in the last 8760 hours.  ProBNP (last 3 results) No results for input(s): PROBNP in the last 8760 hours.  Radiological Exams: No results found.  Assessment/Plan Active Problems:   Acute on chronic respiratory failure with hypoxia (HCC)   COPD, severe (HCC)   Lung mass   Left lower lobe pneumonia (HCC)   1. Acute on chronic respiratory failure hypoxia continue with oxygen therapy as tolerated 2. Severe COPD at baseline continue with nebs 3. Lung mass follow-up after discharge 4. Left lower lobe pneumonia treated clinically improving   I have personally seen and evaluated the patient, evaluated laboratory and imaging results, formulated the assessment and plan and placed orders. The Patient requires high complexity decision making for assessment and support.  Case was discussed on Rounds with the Respiratory Therapy Staff  Yevonne Pax, MD [facility] Pulmonary Critical Care Medicine Sleep Medicine

## 2018-04-20 DIAGNOSIS — J9621 Acute and chronic respiratory failure with hypoxia: Secondary | ICD-10-CM | POA: Diagnosis not present

## 2018-04-20 DIAGNOSIS — J449 Chronic obstructive pulmonary disease, unspecified: Secondary | ICD-10-CM | POA: Diagnosis not present

## 2018-04-20 DIAGNOSIS — J181 Lobar pneumonia, unspecified organism: Secondary | ICD-10-CM | POA: Diagnosis not present

## 2018-04-20 DIAGNOSIS — R918 Other nonspecific abnormal finding of lung field: Secondary | ICD-10-CM | POA: Diagnosis not present

## 2018-04-20 LAB — CK TOTAL AND CKMB (NOT AT ARMC)
CK, MB: 2.8 ng/mL (ref 0.5–5.0)
CK, MB: 2.2 ng/mL (ref 0.5–5.0)
CK, MB: 3.2 ng/mL (ref 0.5–5.0)
CK, MB: 2.9 ng/mL (ref 0.5–5.0)
Relative Index: INVALID (ref 0.0–2.5)
Relative Index: INVALID (ref 0.0–2.5)
Relative Index: INVALID (ref 0.0–2.5)
Relative Index: INVALID (ref 0.0–2.5)
Total CK: 25 U/L — ABNORMAL LOW (ref 38–234)
Total CK: 34 U/L — ABNORMAL LOW (ref 38–234)
Total CK: 37 U/L — ABNORMAL LOW (ref 38–234)
Total CK: 57 U/L (ref 38–234)

## 2018-04-20 NOTE — Progress Notes (Signed)
Pulmonary Critical Care Medicine [facility] [place]   PULMONARY CRITICAL CARE SERVICE  PROGRESS NOTE  Date of Service: 04/20/2018  Erick Ocheltree  LDJ:[id number]  DOB: 16-Jan-1946   DOA: 04/12/2018  Referring Physician: Carron Curie, MD  HPI: Corneisha Askelson is a 73 y.o. female seen for follow up of Acute on Chronic Respiratory Failure.  Patient is comfortable on nasal cannula right now comfortable without distress  Medications: Reviewed on Rounds  Physical Exam:  Vitals: Temperature 97.1 pulse 73 respiratory 18 blood pressure 127/67 saturations 100%  Ventilator Settings nasal cannula at this time off the ventilator  . General: Comfortable at this time . Eyes: Grossly normal lids, irises & conjunctiva . ENT: grossly tongue is normal . Neck: no obvious mass . Cardiovascular: S1 S2 normal no gallop . Respiratory: Coarse breath sounds diminished breath sounds . Abdomen: soft . Skin: no rash seen on limited exam . Musculoskeletal: not rigid . Psychiatric:unable to assess . Neurologic: no seizure no involuntary movements         Lab Data:   Basic Metabolic Panel: No results for input(s): NA, K, CL, CO2, GLUCOSE, BUN, CREATININE, CALCIUM, MG, PHOS in the last 168 hours.  ABG: No results for input(s): PHART, PCO2ART, PO2ART, HCO3, O2SAT in the last 168 hours.  Liver Function Tests: No results for input(s): AST, ALT, ALKPHOS, BILITOT, PROT, ALBUMIN in the last 168 hours. No results for input(s): LIPASE, AMYLASE in the last 168 hours. No results for input(s): AMMONIA in the last 168 hours.  CBC: Recent Labs  Lab 04/18/18 1639  WBC 8.7  NEUTROABS 7.7  HGB 11.5*  HCT 36.4  MCV 101.1*  PLT 219    Cardiac Enzymes: Recent Labs  Lab 04/18/18 1639 04/18/18 1916 04/19/18 0031 04/19/18 0643 04/19/18 1256 04/19/18 1901 04/20/18 0044 04/20/18 0720  CKTOTAL  --   --  38 59 49 25* 25* 34*  CKMB  --   --  3.3 3.4 3.5 2.5 2.2 2.8  TROPONINI 0.04* 0.03* <0.03  <0.03 <0.03  --   --   --     BNP (last 3 results) No results for input(s): BNP in the last 8760 hours.  ProBNP (last 3 results) No results for input(s): PROBNP in the last 8760 hours.  Radiological Exams: No results found.  Assessment/Plan Active Problems:   Acute on chronic respiratory failure with hypoxia (HCC)   COPD, severe (HCC)   Lung mass   Left lower lobe pneumonia (HCC)   1. Acute on chronic respiratory failure with hypoxia requires ongoing oxygen therapy which will be continued.  Titrate oxygen as tolerated. 2. Severe COPD at baseline we will continue present management 3. Lung mass patient is going to follow-up after discharge. 4. Left lower lobe pneumonia treated we will continue to follow.   I have personally seen and evaluated the patient, evaluated laboratory and imaging results, formulated the assessment and plan and placed orders. The Patient requires high complexity decision making for assessment and support.  Case was discussed on Rounds with the Respiratory Therapy Staff  Yevonne Pax, MD [facility] Pulmonary Critical Care Medicine Sleep Medicine

## 2018-04-21 DIAGNOSIS — R918 Other nonspecific abnormal finding of lung field: Secondary | ICD-10-CM | POA: Diagnosis not present

## 2018-04-21 DIAGNOSIS — J181 Lobar pneumonia, unspecified organism: Secondary | ICD-10-CM | POA: Diagnosis not present

## 2018-04-21 DIAGNOSIS — J449 Chronic obstructive pulmonary disease, unspecified: Secondary | ICD-10-CM | POA: Diagnosis not present

## 2018-04-21 DIAGNOSIS — J9621 Acute and chronic respiratory failure with hypoxia: Secondary | ICD-10-CM | POA: Diagnosis not present

## 2018-04-21 LAB — BASIC METABOLIC PANEL
Anion gap: 5 (ref 5–15)
BUN: 19 mg/dL (ref 8–23)
CALCIUM: 8.9 mg/dL (ref 8.9–10.3)
CO2: 32 mmol/L (ref 22–32)
Chloride: 98 mmol/L (ref 98–111)
Creatinine, Ser: 0.63 mg/dL (ref 0.44–1.00)
GFR calc Af Amer: >60 mL/min (ref >60)
GFR calc non Af Amer: >60 mL/min (ref >60)
Glucose, Bld: 91 mg/dL (ref 70–99)
Potassium: 3.5 mmol/L (ref 3.5–5.1)
Sodium: 135 mmol/L (ref 135–145)

## 2018-04-21 LAB — CK TOTAL AND CKMB (NOT AT ARMC)
CK TOTAL: 10 U/L — AB (ref 38–234)
CK, MB: 2.5 ng/mL (ref 0.5–5.0)
CK, MB: 3 ng/mL (ref 0.5–5.0)
Relative Index: INVALID (ref 0.0–2.5)
Relative Index: INVALID (ref 0.0–2.5)
Total CK: 23 U/L — ABNORMAL LOW (ref 38–234)

## 2018-04-21 NOTE — Progress Notes (Signed)
Pulmonary Critical Care Medicine [facility] [place]   PULMONARY CRITICAL CARE SERVICE  PROGRESS NOTE  Date of Service: 04/21/2018  Laura Phillips  JIR:[id number]  DOB: May 28, 1945   DOA: 04/12/2018  Referring Physician: Carron Curie, MD  HPI: Laura Phillips is a 73 y.o. female seen for follow up of Acute on Chronic Respiratory Failure.  Patient is comfortable right now on nasal cannula.  Doing well on 3 L  Medications: Reviewed on Rounds  Physical Exam:  Vitals: Temperature 97.2 pulse 67 respiratory 18 blood pressure 112/52 saturations 100%  Ventilator Settings off the ventilator on 3 L nasal cannula  . General: Comfortable at this time . Eyes: Grossly normal lids, irises & conjunctiva . ENT: grossly tongue is normal . Neck: no obvious mass . Cardiovascular: S1 S2 normal no gallop . Respiratory: No rhonchi rales are noted at this time . Abdomen: soft . Skin: no rash seen on limited exam . Musculoskeletal: not rigid . Psychiatric:unable to assess . Neurologic: no seizure no involuntary movements         Lab Data:   Basic Metabolic Panel: Recent Labs  Lab 04/21/18 0726  NA 135  K 3.5  CL 98  CO2 32  GLUCOSE 91  BUN 19  CREATININE 0.63  CALCIUM 8.9    ABG: No results for input(s): PHART, PCO2ART, PO2ART, HCO3, O2SAT in the last 168 hours.  Liver Function Tests: No results for input(s): AST, ALT, ALKPHOS, BILITOT, PROT, ALBUMIN in the last 168 hours. No results for input(s): LIPASE, AMYLASE in the last 168 hours. No results for input(s): AMMONIA in the last 168 hours.  CBC: Recent Labs  Lab 04/18/18 1639  WBC 8.7  NEUTROABS 7.7  HGB 11.5*  HCT 36.4  MCV 101.1*  PLT 219    Cardiac Enzymes: Recent Labs  Lab 04/18/18 1639 04/18/18 1916 04/19/18 0031 04/19/18 0643 04/19/18 1256  04/20/18 0720 04/20/18 1410 04/20/18 1928 04/21/18 0119 04/21/18 0726  CKTOTAL  --   --  38 59 49   < > 34* 37* 57 10* 23*  CKMB  --   --  3.3 3.4 3.5   <  > 2.8 3.2 2.9 2.5 3.0  TROPONINI 0.04* 0.03* <0.03 <0.03 <0.03  --   --   --   --   --   --    < > = values in this interval not displayed.    BNP (last 3 results) No results for input(s): BNP in the last 8760 hours.  ProBNP (last 3 results) No results for input(s): PROBNP in the last 8760 hours.  Radiological Exams: No results found.  Assessment/Plan Active Problems:   Acute on chronic respiratory failure with hypoxia (HCC)   COPD, severe (HCC)   Lung mass   Left lower lobe pneumonia (HCC)   1. Acute on chronic respiratory failure with hypoxia continue with oxygen therapy she is now back to her baseline 2. Severe COPD at baseline we will continue with present management 3. Lung mass follow-up after discharge 4. Left lower lobe pneumonia clinically improved   I have personally seen and evaluated the patient, evaluated laboratory and imaging results, formulated the assessment and plan and placed orders. The Patient requires high complexity decision making for assessment and support.  Case was discussed on Rounds with the Respiratory Therapy Staff  Yevonne Pax, MD [facility] Pulmonary Critical Care Medicine Sleep Medicine

## 2018-07-16 DEATH — deceased

## 2020-03-10 IMAGING — DX DG CHEST 1V PORT
1 series · 2 of 2 positions shown · non-contrast
Comparison: None.

CLINICAL DATA: COPD exacerbation.

EXAM:
PORTABLE CHEST 1 VIEW

[Series 1: chest · 0.14mm/px · 2 of 2 slices shown]
[im 1/2]
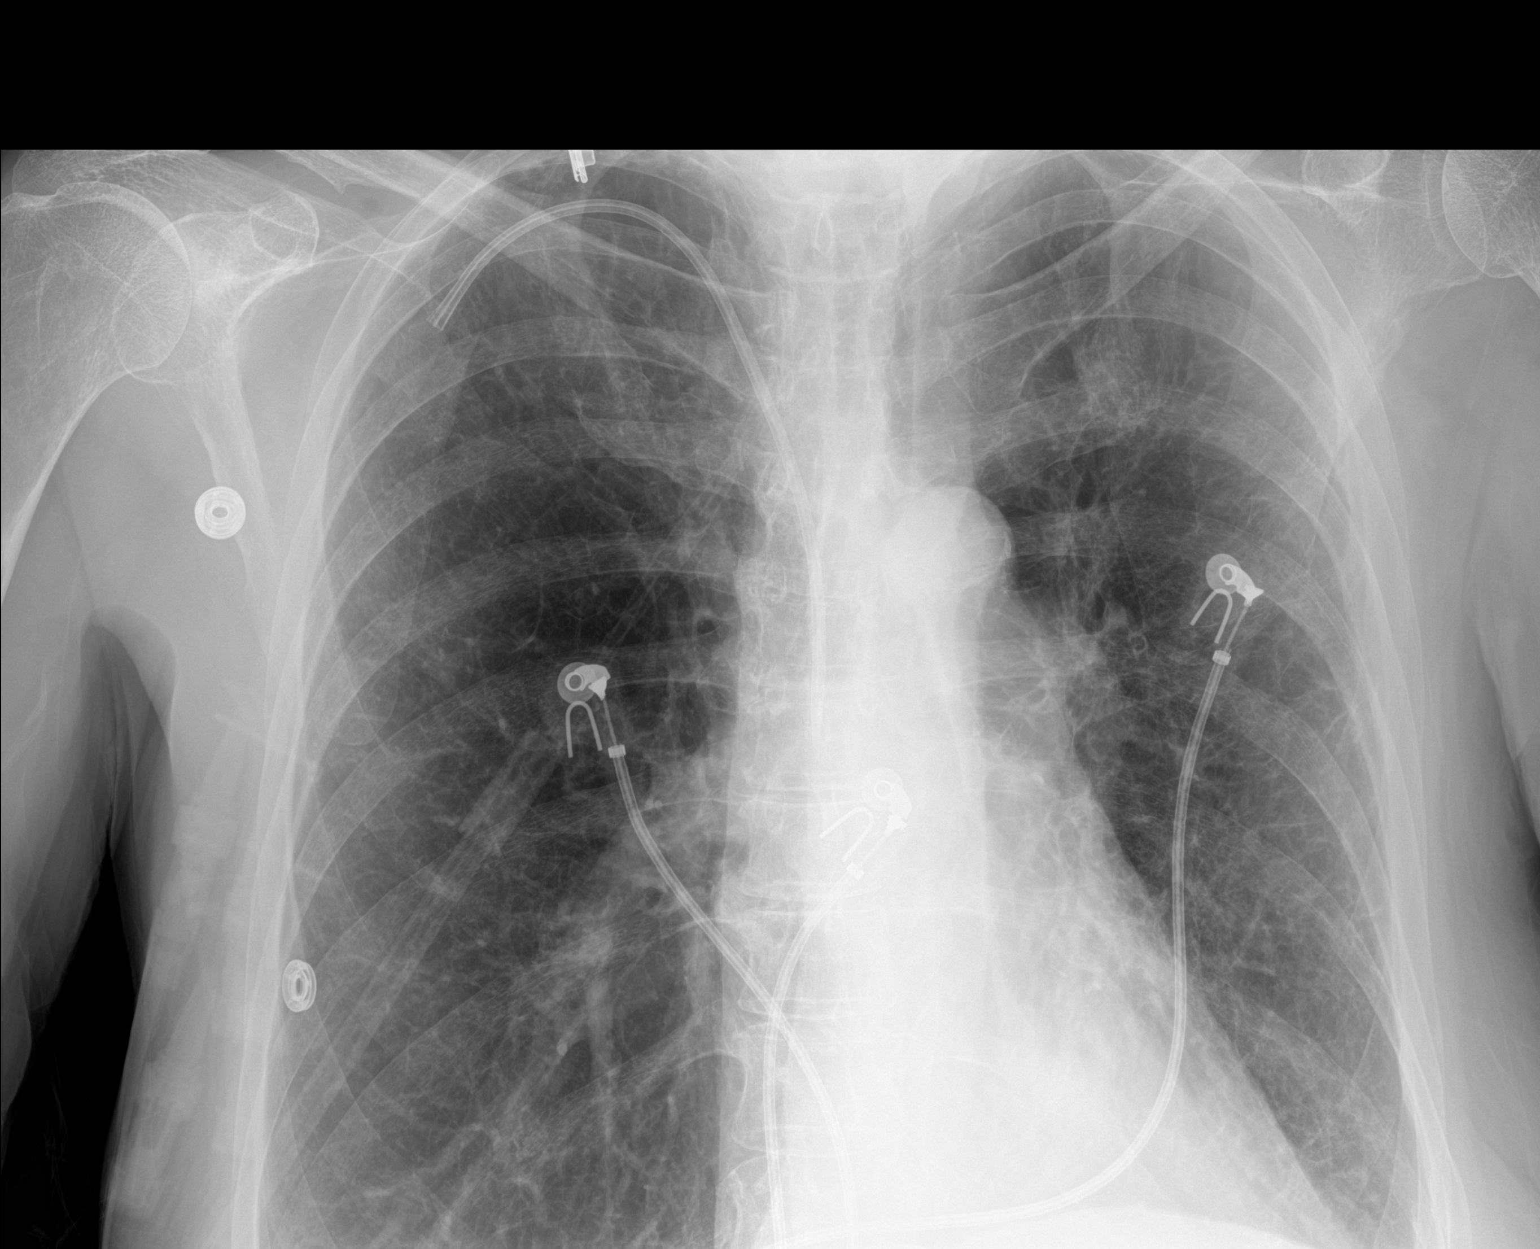
[im 2/2]
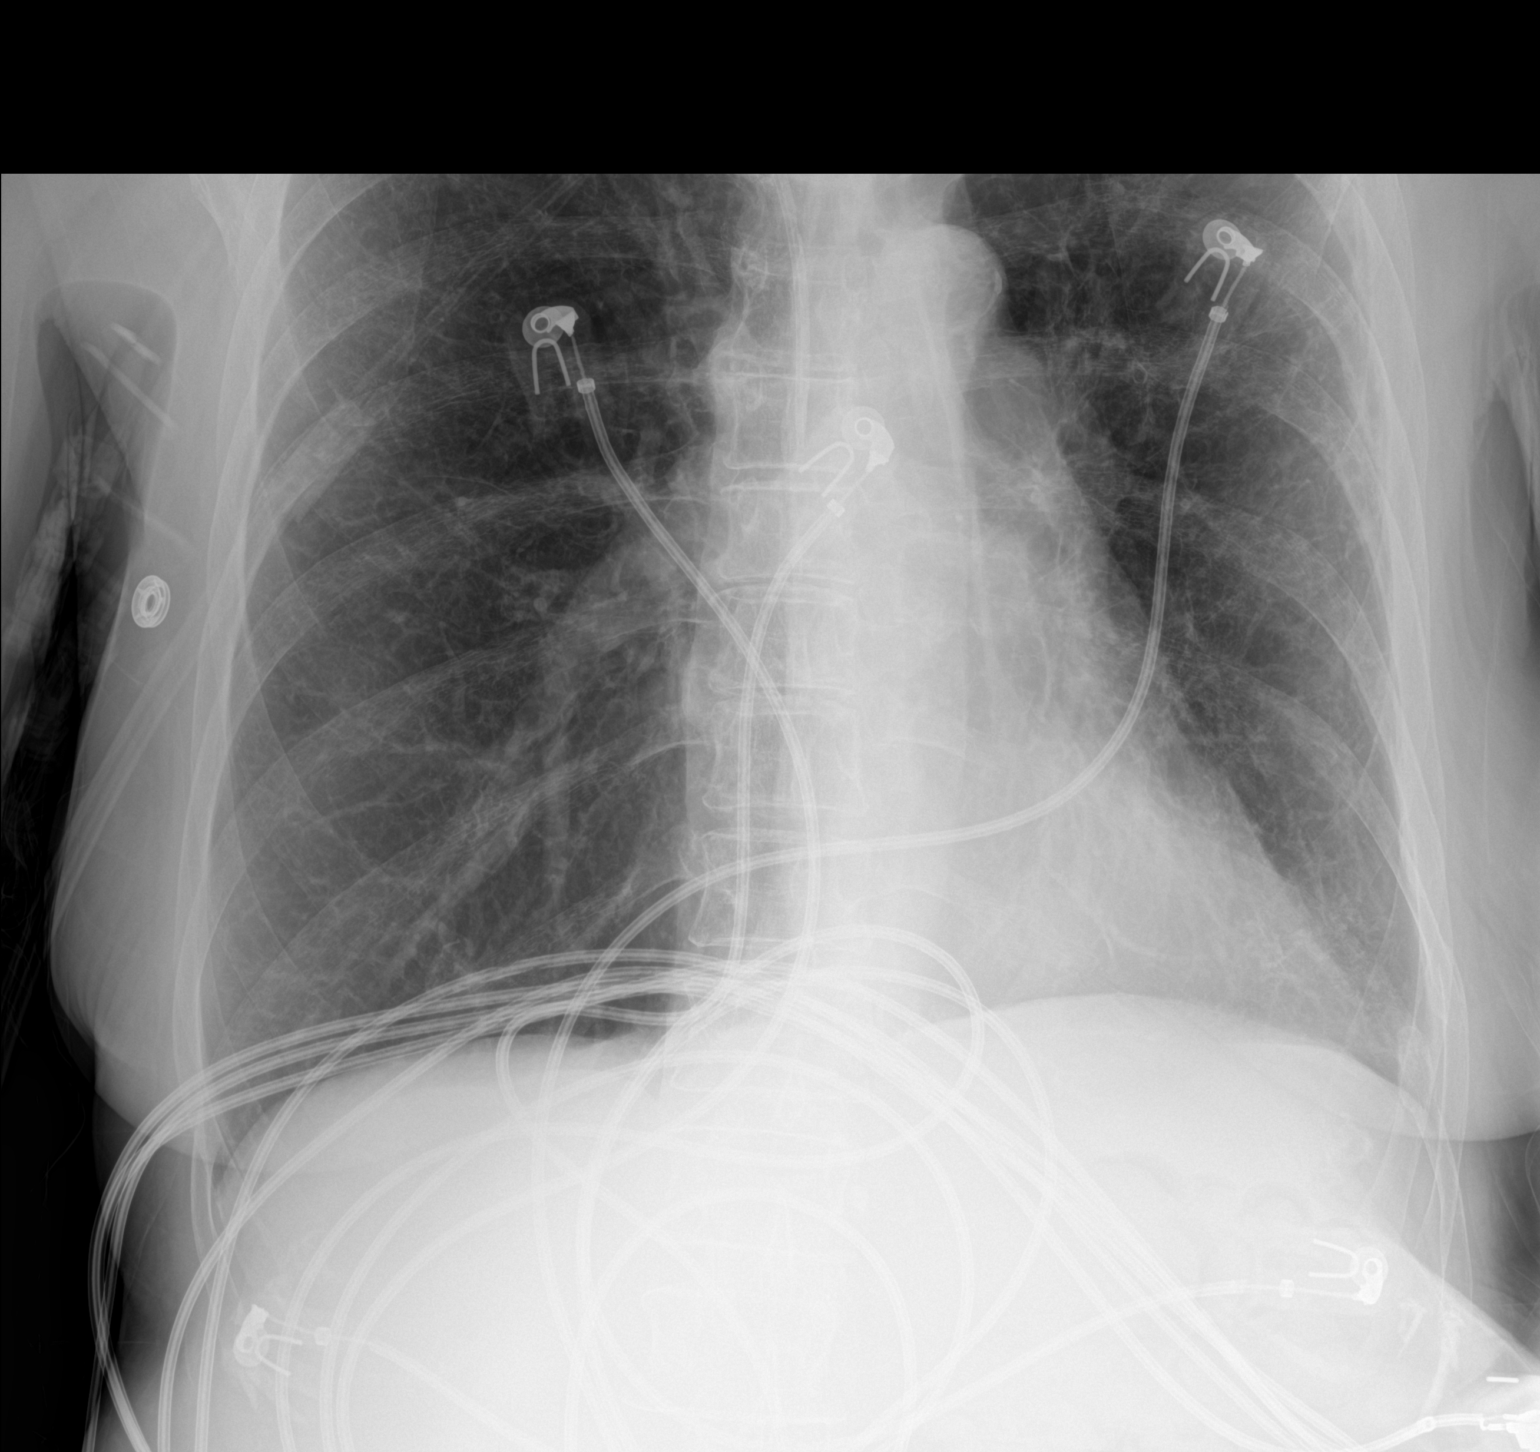

[2 of 2 positions shown; findings below may reference images not displayed]

FINDINGS: Cardiac silhouette upper normal in size. Thoracic aorta
atherosclerotic. Hilar and mediastinal contours otherwise
unremarkable. Emphysematous changes throughout both lungs with
hyperinflation and mildly prominent bronchovascular markings
diffusely. Lungs otherwise clear. No confluent airspace
consolidation. No pleural effusions. RIGHT jugular central venous
catheter tip projects over the UPPER SVC.
IMPRESSION: 1. No acute cardiopulmonary disease.
2.  Emphysema. ([id number])
